# Patient Record
Sex: Male | Born: 1965 | Race: Black or African American | Hispanic: No | Smoking: Current every day smoker
Health system: Southern US, Community
[De-identification: ages and names within clinical notes are randomized; demographics above are authoritative.]

## PROBLEM LIST (undated history)

## (undated) ENCOUNTER — Emergency Department (HOSPITAL_COMMUNITY): Admission: EM | Payer: Medicare Other | Source: Home / Self Care

## (undated) DIAGNOSIS — F319 Bipolar disorder, unspecified: Secondary | ICD-10-CM

## (undated) DIAGNOSIS — F329 Major depressive disorder, single episode, unspecified: Secondary | ICD-10-CM

## (undated) DIAGNOSIS — I10 Essential (primary) hypertension: Secondary | ICD-10-CM

## (undated) DIAGNOSIS — Z21 Asymptomatic human immunodeficiency virus [HIV] infection status: Secondary | ICD-10-CM

## (undated) DIAGNOSIS — I219 Acute myocardial infarction, unspecified: Secondary | ICD-10-CM

## (undated) DIAGNOSIS — F419 Anxiety disorder, unspecified: Secondary | ICD-10-CM

## (undated) DIAGNOSIS — B20 Human immunodeficiency virus [HIV] disease: Secondary | ICD-10-CM

## (undated) DIAGNOSIS — F32A Depression, unspecified: Secondary | ICD-10-CM

---

## 2016-10-02 DIAGNOSIS — I10 Essential (primary) hypertension: Secondary | ICD-10-CM | POA: Diagnosis not present

## 2016-10-02 DIAGNOSIS — F319 Bipolar disorder, unspecified: Secondary | ICD-10-CM | POA: Diagnosis not present

## 2016-10-02 DIAGNOSIS — Z21 Asymptomatic human immunodeficiency virus [HIV] infection status: Secondary | ICD-10-CM | POA: Diagnosis not present

## 2016-10-23 DIAGNOSIS — Z23 Encounter for immunization: Secondary | ICD-10-CM | POA: Diagnosis not present

## 2017-02-15 DIAGNOSIS — S00452A Superficial foreign body of left ear, initial encounter: Secondary | ICD-10-CM | POA: Diagnosis not present

## 2017-02-15 DIAGNOSIS — L539 Erythematous condition, unspecified: Secondary | ICD-10-CM | POA: Diagnosis not present

## 2017-02-15 DIAGNOSIS — I10 Essential (primary) hypertension: Secondary | ICD-10-CM | POA: Diagnosis not present

## 2017-02-15 DIAGNOSIS — Z79899 Other long term (current) drug therapy: Secondary | ICD-10-CM | POA: Diagnosis not present

## 2017-02-15 DIAGNOSIS — T162XXA Foreign body in left ear, initial encounter: Secondary | ICD-10-CM | POA: Diagnosis not present

## 2017-02-15 DIAGNOSIS — F17210 Nicotine dependence, cigarettes, uncomplicated: Secondary | ICD-10-CM | POA: Diagnosis not present

## 2017-02-15 DIAGNOSIS — Z842 Family history of other diseases of the genitourinary system: Secondary | ICD-10-CM | POA: Diagnosis not present

## 2017-02-15 DIAGNOSIS — Z21 Asymptomatic human immunodeficiency virus [HIV] infection status: Secondary | ICD-10-CM | POA: Diagnosis not present

## 2017-02-22 DIAGNOSIS — R760 Raised antibody titer: Secondary | ICD-10-CM | POA: Diagnosis not present

## 2017-02-22 DIAGNOSIS — R739 Hyperglycemia, unspecified: Secondary | ICD-10-CM | POA: Diagnosis not present

## 2017-02-22 DIAGNOSIS — I10 Essential (primary) hypertension: Secondary | ICD-10-CM | POA: Diagnosis not present

## 2017-02-22 DIAGNOSIS — E782 Mixed hyperlipidemia: Secondary | ICD-10-CM | POA: Diagnosis not present

## 2017-02-22 DIAGNOSIS — Z21 Asymptomatic human immunodeficiency virus [HIV] infection status: Secondary | ICD-10-CM | POA: Diagnosis not present

## 2017-03-19 DIAGNOSIS — R739 Hyperglycemia, unspecified: Secondary | ICD-10-CM | POA: Diagnosis not present

## 2017-03-19 DIAGNOSIS — I10 Essential (primary) hypertension: Secondary | ICD-10-CM | POA: Diagnosis not present

## 2017-03-19 DIAGNOSIS — Z21 Asymptomatic human immunodeficiency virus [HIV] infection status: Secondary | ICD-10-CM | POA: Diagnosis not present

## 2017-03-19 DIAGNOSIS — F329 Major depressive disorder, single episode, unspecified: Secondary | ICD-10-CM | POA: Diagnosis not present

## 2017-03-21 DIAGNOSIS — F319 Bipolar disorder, unspecified: Secondary | ICD-10-CM | POA: Diagnosis not present

## 2017-04-06 DIAGNOSIS — I10 Essential (primary) hypertension: Secondary | ICD-10-CM | POA: Diagnosis not present

## 2017-04-06 DIAGNOSIS — Z21 Asymptomatic human immunodeficiency virus [HIV] infection status: Secondary | ICD-10-CM | POA: Diagnosis not present

## 2017-04-06 DIAGNOSIS — Z720 Tobacco use: Secondary | ICD-10-CM | POA: Diagnosis not present

## 2017-04-10 DIAGNOSIS — Z720 Tobacco use: Secondary | ICD-10-CM | POA: Diagnosis not present

## 2017-04-10 DIAGNOSIS — I10 Essential (primary) hypertension: Secondary | ICD-10-CM | POA: Diagnosis not present

## 2017-04-10 DIAGNOSIS — D72819 Decreased white blood cell count, unspecified: Secondary | ICD-10-CM | POA: Diagnosis not present

## 2017-04-16 DIAGNOSIS — B20 Human immunodeficiency virus [HIV] disease: Secondary | ICD-10-CM | POA: Diagnosis not present

## 2017-04-16 DIAGNOSIS — Z720 Tobacco use: Secondary | ICD-10-CM | POA: Diagnosis not present

## 2017-04-16 DIAGNOSIS — I10 Essential (primary) hypertension: Secondary | ICD-10-CM | POA: Diagnosis not present

## 2017-04-16 DIAGNOSIS — S0081XD Abrasion of other part of head, subsequent encounter: Secondary | ICD-10-CM | POA: Diagnosis not present

## 2017-04-17 DIAGNOSIS — Z79899 Other long term (current) drug therapy: Secondary | ICD-10-CM | POA: Diagnosis not present

## 2017-04-18 DIAGNOSIS — F319 Bipolar disorder, unspecified: Secondary | ICD-10-CM | POA: Diagnosis not present

## 2017-04-22 DIAGNOSIS — Z79899 Other long term (current) drug therapy: Secondary | ICD-10-CM | POA: Diagnosis not present

## 2017-05-23 DIAGNOSIS — Z79899 Other long term (current) drug therapy: Secondary | ICD-10-CM | POA: Diagnosis not present

## 2017-07-08 ENCOUNTER — Encounter: Payer: Self-pay | Admitting: Internal Medicine

## 2017-07-08 DIAGNOSIS — I10 Essential (primary) hypertension: Secondary | ICD-10-CM | POA: Diagnosis not present

## 2017-07-08 DIAGNOSIS — E782 Mixed hyperlipidemia: Secondary | ICD-10-CM | POA: Diagnosis not present

## 2017-07-08 DIAGNOSIS — Z21 Asymptomatic human immunodeficiency virus [HIV] infection status: Secondary | ICD-10-CM | POA: Diagnosis not present

## 2017-07-08 DIAGNOSIS — Z113 Encounter for screening for infections with a predominantly sexual mode of transmission: Secondary | ICD-10-CM | POA: Diagnosis not present

## 2017-07-08 DIAGNOSIS — Z23 Encounter for immunization: Secondary | ICD-10-CM | POA: Diagnosis not present

## 2017-07-08 DIAGNOSIS — R972 Elevated prostate specific antigen [PSA]: Secondary | ICD-10-CM | POA: Diagnosis not present

## 2017-07-08 DIAGNOSIS — F329 Major depressive disorder, single episode, unspecified: Secondary | ICD-10-CM | POA: Diagnosis not present

## 2017-07-08 DIAGNOSIS — Z13220 Encounter for screening for lipoid disorders: Secondary | ICD-10-CM | POA: Diagnosis not present

## 2017-07-08 DIAGNOSIS — Z131 Encounter for screening for diabetes mellitus: Secondary | ICD-10-CM | POA: Diagnosis not present

## 2017-07-08 DIAGNOSIS — R030 Elevated blood-pressure reading, without diagnosis of hypertension: Secondary | ICD-10-CM | POA: Diagnosis not present

## 2017-07-08 DIAGNOSIS — F319 Bipolar disorder, unspecified: Secondary | ICD-10-CM | POA: Diagnosis not present

## 2017-08-01 NOTE — Congregational Nurse Program (Signed)
Congregational Nurse Program Note  Date of Encounter: 07/24/2017  Past Medical History: No past medical history on file.  Encounter Details: CNP Questionnaire - 07/24/17 0935      Questionnaire   Patient Status  Not Applicable    Race  Black or African American    Location Patient Served At  Not Applicable    Insurance  Medicaid    Uninsured  Not Applicable    Food  Yes, have food insecurities;Within past 12 months, worried food would run out with no money to buy more;Within past 12 months, food ran out with no money to buy more    Housing/Utilities  No permanent housing    Transportation  Yes, need transportation assistance;Provided transportation assistance (bus pass, taxi voucher, etc.)    Interpersonal Safety  No, do not feel physically and emotionally safe where you currently live    Medication  Yes, have medication insecurities    Medical Provider  No    Referrals  Other    ED Visit Averted  Not Applicable    Life-Saving Intervention Made  Not Applicable       Has recently arrived from the [place] area.  Homeless.  Referred to me by social work Tax inspectorintern.  Safety plan developed as client is depressed and feeling hopeless.  Intern was able to [facility] as a lobby guest.  Client to check in with me on Friday.

## 2017-08-10 ENCOUNTER — Emergency Department (HOSPITAL_COMMUNITY): Payer: Medicare Other

## 2017-08-10 ENCOUNTER — Encounter (HOSPITAL_COMMUNITY): Payer: Self-pay | Admitting: Oncology

## 2017-08-10 ENCOUNTER — Emergency Department (HOSPITAL_COMMUNITY)
Admission: EM | Admit: 2017-08-10 | Discharge: 2017-08-10 | Disposition: A | Discharge status: Home / Self Care | Payer: Medicare Other | Attending: Emergency Medicine | Admitting: Emergency Medicine

## 2017-08-10 ENCOUNTER — Other Ambulatory Visit: Payer: Self-pay

## 2017-08-10 DIAGNOSIS — I10 Essential (primary) hypertension: Secondary | ICD-10-CM | POA: Diagnosis not present

## 2017-08-10 DIAGNOSIS — R079 Chest pain, unspecified: Secondary | ICD-10-CM | POA: Diagnosis not present

## 2017-08-10 DIAGNOSIS — Z21 Asymptomatic human immunodeficiency virus [HIV] infection status: Secondary | ICD-10-CM | POA: Insufficient documentation

## 2017-08-10 DIAGNOSIS — F319 Bipolar disorder, unspecified: Secondary | ICD-10-CM | POA: Diagnosis not present

## 2017-08-10 DIAGNOSIS — F419 Anxiety disorder, unspecified: Secondary | ICD-10-CM | POA: Insufficient documentation

## 2017-08-10 DIAGNOSIS — F410 Panic disorder [episodic paroxysmal anxiety] without agoraphobia: Secondary | ICD-10-CM | POA: Diagnosis not present

## 2017-08-10 DIAGNOSIS — F919 Conduct disorder, unspecified: Secondary | ICD-10-CM | POA: Diagnosis not present

## 2017-08-10 DIAGNOSIS — F99 Mental disorder, not otherwise specified: Secondary | ICD-10-CM

## 2017-08-10 DIAGNOSIS — I252 Old myocardial infarction: Secondary | ICD-10-CM | POA: Diagnosis not present

## 2017-08-10 DIAGNOSIS — F17210 Nicotine dependence, cigarettes, uncomplicated: Secondary | ICD-10-CM | POA: Insufficient documentation

## 2017-08-10 HISTORY — DX: Essential (primary) hypertension: I10

## 2017-08-10 HISTORY — DX: Anxiety disorder, unspecified: F41.9

## 2017-08-10 HISTORY — DX: Asymptomatic human immunodeficiency virus (hiv) infection status: Z21

## 2017-08-10 HISTORY — DX: Acute myocardial infarction, unspecified: I21.9

## 2017-08-10 HISTORY — DX: Bipolar disorder, unspecified: F31.9

## 2017-08-10 HISTORY — DX: Human immunodeficiency virus (HIV) disease: B20

## 2017-08-10 NOTE — ED Notes (Signed)
Pt is endorsing thoughts of suicide w/ plan to overdose on his medication and ETOH.  Pt is calm and cooperative at this time.
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
## 2017-08-10 NOTE — ED Triage Notes (Signed)
Pt bib [facility] d/t CP.  Per EMS pt was walking when he developed shob and CP.  Pt has hx of MI x 2.  Per EMS, pt also w/ hx of anxiety.  Tonight pt had a verbal altercation w/ family.  Etoh on board.  Pt given 324 mg asa as well as 1 nitro tab en route.

## 2017-08-11 ENCOUNTER — Other Ambulatory Visit: Payer: Self-pay

## 2017-08-11 ENCOUNTER — Encounter (HOSPITAL_COMMUNITY): Payer: Self-pay | Admitting: Emergency Medicine

## 2017-08-11 ENCOUNTER — Emergency Department (HOSPITAL_COMMUNITY)
Admission: EM | Admit: 2017-08-11 | Discharge: 2017-08-12 | Disposition: A | Discharge status: Home / Self Care | Payer: Medicare Other | Attending: Emergency Medicine | Admitting: Emergency Medicine

## 2017-08-11 DIAGNOSIS — F1020 Alcohol dependence, uncomplicated: Secondary | ICD-10-CM | POA: Insufficient documentation

## 2017-08-11 DIAGNOSIS — I252 Old myocardial infarction: Secondary | ICD-10-CM | POA: Diagnosis not present

## 2017-08-11 DIAGNOSIS — B20 Human immunodeficiency virus [HIV] disease: Secondary | ICD-10-CM | POA: Diagnosis not present

## 2017-08-11 DIAGNOSIS — I10 Essential (primary) hypertension: Secondary | ICD-10-CM | POA: Insufficient documentation

## 2017-08-11 DIAGNOSIS — Z79899 Other long term (current) drug therapy: Secondary | ICD-10-CM | POA: Diagnosis not present

## 2017-08-11 DIAGNOSIS — F17210 Nicotine dependence, cigarettes, uncomplicated: Secondary | ICD-10-CM | POA: Diagnosis not present

## 2017-08-11 DIAGNOSIS — R45851 Suicidal ideations: Secondary | ICD-10-CM | POA: Diagnosis not present

## 2017-08-11 DIAGNOSIS — F1414 Cocaine abuse with cocaine-induced mood disorder: Secondary | ICD-10-CM | POA: Diagnosis present

## 2017-08-11 DIAGNOSIS — F419 Anxiety disorder, unspecified: Secondary | ICD-10-CM | POA: Diagnosis not present

## 2017-08-11 HISTORY — DX: Depression, unspecified: F32.A

## 2017-08-11 HISTORY — DX: Major depressive disorder, single episode, unspecified: F32.9

## 2017-08-11 LAB — COMPREHENSIVE METABOLIC PANEL
ALK PHOS: 123 U/L (ref 38–126)
ALT: 47 U/L (ref 17–63)
AST: 36 U/L (ref 15–41)
Albumin: 4.3 g/dL (ref 3.5–5.0)
Anion gap: 9 (ref 5–15)
BUN: 10 mg/dL (ref 6–20)
CALCIUM: 9.1 mg/dL (ref 8.9–10.3)
CO2: 25 mmol/L (ref 22–32)
CREATININE: 1.08 mg/dL (ref 0.61–1.24)
Chloride: 108 mmol/L (ref 101–111)
Glucose, Bld: 106 mg/dL — ABNORMAL HIGH (ref 65–99)
Potassium: 3.8 mmol/L (ref 3.5–5.1)
Sodium: 142 mmol/L (ref 135–145)
TOTAL PROTEIN: 7.7 g/dL (ref 6.5–8.1)
Total Bilirubin: 0.7 mg/dL (ref 0.3–1.2)

## 2017-08-11 LAB — RAPID URINE DRUG SCREEN, HOSP PERFORMED
AMPHETAMINES: NOT DETECTED
BARBITURATES: NOT DETECTED
BENZODIAZEPINES: NOT DETECTED
COCAINE: POSITIVE — AB
Opiates: NOT DETECTED
TETRAHYDROCANNABINOL: NOT DETECTED

## 2017-08-11 LAB — CBC
HCT: 41.5 % (ref 39.0–52.0)
Hemoglobin: 14.2 g/dL (ref 13.0–17.0)
MCH: 29.1 pg (ref 26.0–34.0)
MCHC: 34.2 g/dL (ref 30.0–36.0)
MCV: 85 fL (ref 78.0–100.0)
Platelets: 339 10*3/uL (ref 150–400)
RBC: 4.88 MIL/uL (ref 4.22–5.81)
RDW: 14.1 % (ref 11.5–15.5)
WBC: 5.2 10*3/uL (ref 4.0–10.5)

## 2017-08-11 LAB — ETHANOL: ALCOHOL ETHYL (B): 74 mg/dL — AB (ref <10)

## 2017-08-11 LAB — ACETAMINOPHEN LEVEL: Acetaminophen (Tylenol), Serum: <10 ug/mL — ABNORMAL LOW (ref 10–30)

## 2017-08-11 LAB — SALICYLATE LEVEL

## 2017-08-11 MED ORDER — HYDROXYZINE HCL 25 MG PO TABS: 25.0000 mg | ORAL_TABLET | Freq: Three times a day (TID) | ORAL | Status: DC | PRN

## 2017-08-11 MED ORDER — CITALOPRAM HYDROBROMIDE 10 MG PO TABS
20.0000 mg | ORAL_TABLET | Freq: Every day | ORAL | Status: DC
  Administered 2017-08-11 – 2017-08-12 (×2): 20 mg via ORAL
  Filled 2017-08-11 (×2): qty 2

## 2017-08-11 MED ORDER — NICOTINE 21 MG/24HR TD PT24
21.0000 mg | MEDICATED_PATCH | Freq: Every day | TRANSDERMAL | Status: DC
  Administered 2017-08-11 – 2017-08-12 (×3): 21 mg via TRANSDERMAL
  Filled 2017-08-11 (×3): qty 1

## 2017-08-11 MED ORDER — CITALOPRAM HYDROBROMIDE 10 MG PO TABS: 40.0000 mg | ORAL_TABLET | Freq: Every day | ORAL | Status: DC

## 2017-08-11 MED ORDER — GABAPENTIN 400 MG PO CAPS
400.0000 mg | ORAL_CAPSULE | Freq: Two times a day (BID) | ORAL | Status: DC
  Administered 2017-08-11 – 2017-08-12 (×3): 400 mg via ORAL
  Filled 2017-08-11 (×3): qty 1

## 2017-08-11 MED ORDER — BUSPIRONE HCL 10 MG PO TABS
10.0000 mg | ORAL_TABLET | Freq: Two times a day (BID) | ORAL | Status: DC
Start: 2017-08-11 — End: 2017-08-12
  Administered 2017-08-11 – 2017-08-12 (×3): 10 mg via ORAL
  Filled 2017-08-11 (×3): qty 1

## 2017-08-11 MED ORDER — TRAZODONE HCL 100 MG PO TABS: 100.0000 mg | ORAL_TABLET | Freq: Every evening | ORAL | Status: DC | PRN

## 2017-08-11 NOTE — BH Assessment (Signed)
[facility] Assessment Progress Note  Per Nira ConnJason Berry, NP pt is recommended for inpt treatment. TTS attempted to notify the EDP of the recommendation however did not get an answer after multiple attempts at calling PA. Pt's nurse Stephen HiddenGary, RN was notified of the disposition. TTS to seek placement.   Stephen Gordon, MSW, LCSW Therapeutic Triage Specialist  [phone]
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
## 2017-08-11 NOTE — ED Notes (Signed)
Hourly rounding reveals patient sleeping in room. No complaints, stable, in no acute distress. Q15 minute rounds and monitoring via Security Cameras to continue. 

## 2017-08-11 NOTE — ED Notes (Signed)
Report to include Situation, Background, Assessment, and Recommendations [facility]. Patient alert and oriented, warm and dry, in no acute distress. Patient denies, VH and pain. Patient states he has SI, and HI towards his Ex. And hears voices without command. Patient made aware of Q15 minute rounds and security cameras for their safety. Patient instructed to come to me with needs or concerns.

## 2017-08-11 NOTE — ED Triage Notes (Signed)
Patient is feeling suicidal. Patient states he wants to walk out in front of traffic. Patient states he has had some drinks. Patient states he wants to kill himself because his wife cheated on him.

## 2017-08-11 NOTE — ED Notes (Signed)
Pt. Transferred to [facility] from ED to room 36 after screening for contraband. Report to include Situation, Background, Assessment and Recommendations from [facility] ([facility]Rh[facility]ravia RN. Pt. Oriented to unit including Q15 minute rounds as well as the security cameras for their protection. Patient is alert and oriented, warm and dry in no acute distress. Patient denies, VH. Pt. States he has SI, HI and AH telling him to "hurt myself" Pt. Encouraged to let me know if needs arise.
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
## 2017-08-11 NOTE — ED Notes (Signed)
TTS at bedside. 

## 2017-08-11 NOTE — ED Notes (Signed)
Patient is for discharge in the morning. However if he acts out inappropriately he may be discharged immediately per Elliot GurneyJamilson Lord NP

## 2017-08-11 NOTE — ED Notes (Signed)
Patient alert and oriented. Patient states he is having thoughts of harming himself and that's why he came into hospital. Patient did not want to discuss in details. Patient contracts for safety while on unit. Patient denies pain. Patient provided support and encouragement and Q 15 minute checks in progress and patient remains safe on unit. Patient denies HI and A/V hallucinations. Monitoring of patient continues.
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
## 2017-08-12 DIAGNOSIS — F1020 Alcohol dependence, uncomplicated: Secondary | ICD-10-CM | POA: Diagnosis not present

## 2017-08-12 DIAGNOSIS — F419 Anxiety disorder, unspecified: Secondary | ICD-10-CM

## 2017-08-12 DIAGNOSIS — F17210 Nicotine dependence, cigarettes, uncomplicated: Secondary | ICD-10-CM

## 2017-08-12 DIAGNOSIS — R4587 Impulsiveness: Secondary | ICD-10-CM

## 2017-08-12 DIAGNOSIS — F1414 Cocaine abuse with cocaine-induced mood disorder: Secondary | ICD-10-CM | POA: Diagnosis not present

## 2017-08-12 NOTE — Discharge Instructions (Signed)
For your behavioral health needs, you are advised to follow up with [facility].  Contact them at your earliest opportunity to ask about scheduling an intake appointment: ° °     [facility] °     [address] °     [place], [place] [address] °     [phone] °

## 2017-08-12 NOTE — ED Notes (Signed)
Hourly rounding reveals patient sleeping in room. No complaints, stable, in no acute distress. Q15 minute rounds and monitoring via Security Cameras to continue. 
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
## 2017-08-12 NOTE — [facility] Suicide Risk Assessment (Signed)
Suicide Risk Assessment  Discharge Assessment   [facility]   Principal Problem: Cocaine abuse with cocaine-induced mood disorder [facility](HCC) Discharge Diagnoses:  Patient Active Problem List   Diagnosis Date Noted  . Cocaine abuse with cocaine-induced mood disorder (HCC) [F14.14] 08/11/2017  . Alcohol use disorder, moderate, dependence (HCC) [F10.20] 08/11/2017    Total Time spent with patient: 45 minutes  Musculoskeletal: Strength & Muscle Tone: within normal limits Gait & Station: normal Patient leans: N/A  Psychiatric Specialty Exam: Physical Exam  Constitutional: He is oriented to person, place, and time. He appears well-developed and well-nourished.  HENT:  Head: Normocephalic.  Respiratory: Effort normal.  Musculoskeletal: Normal range of motion.  Neurological: He is alert and oriented to person, place, and time.  Psychiatric: Thought content normal. His mood appears anxious. His speech is rapid and/or pressured. He is agitated. Cognition and memory are normal. He expresses impulsivity. He exhibits a depressed mood.   ROS Blood pressure (!) 151/54, pulse (!) 101, temperature 99.5 F (37.5 C), temperature source Oral, resp. rate 16, height 5\' 9"  (1.753 m), weight 76.2 kg (168 lb), SpO2 97 %.Body mass index is 24.81 kg/m. General Appearance: Casual Eye Contact:  Good Speech:  Clear and Coherent and Pressured Volume:  Normal Mood:  Anxious and Depressed Affect:  Congruent and Depressed Thought Process:  Coherent, Goal Directed and Linear Orientation:  Full (Time, Place, and Person) Thought Content:  Logical Suicidal Thoughts:  No Homicidal Thoughts:  No Memory:  Immediate;   Good Recent;   Good Remote;   Fair Judgement:  Fair Insight:  Fair Psychomotor Activity:  Normal Concentration:  Concentration: Good and Attention Span: Good Recall:  Good Fund of Knowledge:  Good Language:  Good Akathisia:  No Handed:  Right AIMS (if indicated):     Assets:  Communication Skills Physical Health Social Support ADL's:  Intact Cognition:  WNL   Mental Status Per Nursing Assessment::   On Admission:   suicidal ideation while "high on cocaine and alcohol"  Demographic Factors:  Male, Low socioeconomic status and Unemployed  Loss Factors: Financial problems/change in socioeconomic status  Historical Factors: Impulsivity  Risk Reduction Factors:   Sense of responsibility to family  Continued Clinical Symptoms:  Alcohol/Substance Abuse/Dependencies  Cognitive Features That Contribute To Risk:  Closed-mindedness    Suicide Risk:  Minimal: No identifiable suicidal ideation.  Patients presenting with no risk factors but with morbid ruminations; may be classified as minimal risk based on the severity of the depressive symptoms    Plan Of Care/Follow-up recommendations:  Activity:  Cocaine abuse with cocaine induced mood disorder Diet:  Heart healthy  Laveda AbbeLaurie Britton Erline Siddoway, NP 08/12/2017, 11:24 AM

## 2017-08-12 NOTE — BH Assessment (Addendum)
B[facility] Assessment Progress Note  Per Thedore MinsMojeed Akintayo, MD, this pt does not require psychiatric hospitalization at this time.  [facility].  This has been included in pt's discharge instructions.  Pt would also benefit from seeing Peer Support Specialists; they will be asked to speak to pt.  Pt's nurse, Kendal Hymendie, has been notified.  Doylene Canninghomas Erica Richwine, MA Triage Specialist [phone]

## 2017-08-12 NOTE — ED Notes (Addendum)
Pt discharged ambulatory with resources.  Discharge instructions to follow up with [facility] was given.  All belongings were returned to pt.  Bus pass was given.  Pt refused discharge vitals.

## 2017-08-13 ENCOUNTER — Ambulatory Visit (HOSPITAL_COMMUNITY): Payer: Self-pay

## 2017-08-15 DIAGNOSIS — Z789 Other specified health status: Secondary | ICD-10-CM | POA: Diagnosis not present

## 2017-08-16 DIAGNOSIS — Z789 Other specified health status: Secondary | ICD-10-CM | POA: Diagnosis not present

## 2017-09-19 ENCOUNTER — Telehealth (HOSPITAL_COMMUNITY): Payer: Self-pay

## 2017-09-26 NOTE — Congregational Nurse Program (Signed)
Congregational Nurse Program Note  Date of Encounter: 08/30/2017  Past Medical History: Past Medical History:  Diagnosis Date  . Anxiety   . Bipolar 1 disorder (HCC)   . Bipolar 1 disorder (HCC)   . Depression   . HIV (human immunodeficiency virus infection) (HCC)   . Hypertension   . MI (myocardial infarction) (HCC)    x 2    Encounter Details: CNP Questionnaire - 08/30/17 1619      Questionnaire   Patient Status  Not Applicable    Race  Black or African American    Location Patient Served At  Not Applicable    Insurance  Medicaid    Uninsured  Not Applicable    Food  Yes, have food insecurities;Within past 12 months, worried food would run out with no money to buy more;Within past 12 months, food ran out with no money to buy more    Housing/Utilities  No permanent housing    Transportation  Yes, need transportation assistance;Provided transportation assistance (bus pass, taxi voucher, etc.)    Interpersonal Safety  No, do not feel physically and emotionally safe where you currently live    Medication  Yes, have medication insecurities    Medical Provider  No    Referrals  Other    ED Visit Averted  Not Applicable    Life-Saving Intervention Made  Not Applicable     Requesting assistance with resources for substance abuse and his depression  Referred to [facility] for [facility]ative care

## 2017-10-04 NOTE — Congregational Nurse Program (Signed)
Congregational Nurse Program Note  Date of Encounter: 10/02/2017  Past Medical History: Past Medical History:  Diagnosis Date  . Anxiety   . Bipolar 1 disorder (HCC)   . Bipolar 1 disorder (HCC)   . Depression   . HIV (human immunodeficiency virus infection) (HCC)   . Hypertension   . MI (myocardial infarction) (HCC)    x 2    Encounter Details: CNP Questionnaire - 10/02/17 1451      Questionnaire   Patient Status  Not Applicable    Race  Black or African American    Location Patient Served At  Not Applicable    Insurance  Medicaid    Uninsured  Not Applicable    Food  Yes, have food insecurities;Within past 12 months, worried food would run out with no money to buy more;Within past 12 months, food ran out with no money to buy more    Housing/Utilities  No permanent housing    Transportation  Yes, need transportation assistance;Provided transportation assistance (bus pass, taxi voucher, etc.)    Interpersonal Safety  No, do not feel physically and emotionally safe where you currently live    Medication  Yes, have medication insecurities    Medical Provider  No    Referrals  Restaurant manager, fast foodther;Area Agency;Behavioral/Mental Health Provider;Primary Care Provider/Clinic    ED Visit Averted  Not Applicable    Life-Saving Intervention Made  Not Applicable      States has not been able to be seen at [facility] due to volume.  He was directed to come back after the holidays.  Bus passes given to return to [facility] for behavioral health and substance abuse treatment.  In the process of obtaining appointment [facility].  Client is to return to see me on Friday to sign ROI for the [facility] to obtain his [place] from [place].

## 2017-10-09 ENCOUNTER — Other Ambulatory Visit: Payer: Self-pay

## 2017-10-09 ENCOUNTER — Emergency Department (HOSPITAL_COMMUNITY): Payer: Medicare Other

## 2017-10-09 ENCOUNTER — Emergency Department (HOSPITAL_COMMUNITY)
Admission: EM | Admit: 2017-10-09 | Discharge: 2017-10-09 | Disposition: A | Discharge status: Home / Self Care | Payer: Medicare Other | Attending: Emergency Medicine | Admitting: Emergency Medicine

## 2017-10-09 ENCOUNTER — Encounter (HOSPITAL_COMMUNITY): Payer: Self-pay

## 2017-10-09 DIAGNOSIS — R0602 Shortness of breath: Secondary | ICD-10-CM | POA: Diagnosis not present

## 2017-10-09 DIAGNOSIS — I10 Essential (primary) hypertension: Secondary | ICD-10-CM | POA: Insufficient documentation

## 2017-10-09 DIAGNOSIS — R062 Wheezing: Secondary | ICD-10-CM | POA: Diagnosis not present

## 2017-10-09 DIAGNOSIS — Z21 Asymptomatic human immunodeficiency virus [HIV] infection status: Secondary | ICD-10-CM | POA: Insufficient documentation

## 2017-10-09 DIAGNOSIS — Z79899 Other long term (current) drug therapy: Secondary | ICD-10-CM | POA: Insufficient documentation

## 2017-10-09 DIAGNOSIS — R0789 Other chest pain: Secondary | ICD-10-CM | POA: Diagnosis not present

## 2017-10-09 DIAGNOSIS — F17210 Nicotine dependence, cigarettes, uncomplicated: Secondary | ICD-10-CM | POA: Insufficient documentation

## 2017-10-09 LAB — BASIC METABOLIC PANEL
Anion gap: 11 (ref 5–15)
BUN: 14 mg/dL (ref 6–20)
CHLORIDE: 106 mmol/L (ref 101–111)
CO2: 20 mmol/L — AB (ref 22–32)
CREATININE: 0.93 mg/dL (ref 0.61–1.24)
Calcium: 9.6 mg/dL (ref 8.9–10.3)
GFR calc non Af Amer: >60 mL/min (ref >60)
Glucose, Bld: 104 mg/dL — ABNORMAL HIGH (ref 65–99)
POTASSIUM: 4 mmol/L (ref 3.5–5.1)
Sodium: 137 mmol/L (ref 135–145)

## 2017-10-09 LAB — CBC
HEMATOCRIT: 43.1 % (ref 39.0–52.0)
Hemoglobin: 14.5 g/dL (ref 13.0–17.0)
MCH: 29.5 pg (ref 26.0–34.0)
MCHC: 33.6 g/dL (ref 30.0–36.0)
MCV: 87.8 fL (ref 78.0–100.0)
PLATELETS: 282 10*3/uL (ref 150–400)
RBC: 4.91 MIL/uL (ref 4.22–5.81)
RDW: 15.7 % — ABNORMAL HIGH (ref 11.5–15.5)
WBC: 5.1 10*3/uL (ref 4.0–10.5)

## 2017-10-09 LAB — I-STAT TROPONIN, ED
Troponin i, poc: 0.01 ng/mL (ref 0.00–0.08)
Troponin i, poc: 0 ng/mL (ref 0.00–0.08)

## 2017-10-09 MED ORDER — ALBUTEROL SULFATE HFA 108 (90 BASE) MCG/ACT IN AERS
2.0000 | INHALATION_SPRAY | RESPIRATORY_TRACT | Status: DC | PRN
  Administered 2017-10-09: 2 via RESPIRATORY_TRACT
  Filled 2017-10-09: qty 6.7

## 2017-10-09 MED ORDER — IPRATROPIUM BROMIDE 0.02 % IN SOLN
0.5000 mg | Freq: Once | RESPIRATORY_TRACT | Status: AC
Start: 2017-10-09 — End: 2017-10-09
  Administered 2017-10-09: 0.5 mg via RESPIRATORY_TRACT
  Filled 2017-10-09: qty 2.5

## 2017-10-09 MED ORDER — ALBUTEROL SULFATE (2.5 MG/3ML) 0.083% IN NEBU
5.0000 mg | INHALATION_SOLUTION | Freq: Once | RESPIRATORY_TRACT | Status: AC
Start: 2017-10-09 — End: 2017-10-09
  Administered 2017-10-09: 5 mg via RESPIRATORY_TRACT
  Filled 2017-10-09: qty 6

## 2017-10-09 NOTE — ED Notes (Signed)
ED Provider at bedside. 

## 2017-10-09 NOTE — ED Triage Notes (Signed)
Pt states that he was on a smoke break when he began to have chest tightness and SOB, states he just got over a cold. Denies other cardiac symptoms

## 2017-10-09 NOTE — ED Notes (Signed)
Pt ambulated in the hallway independently with oxygen staying at 100%, reports chest still feels tight, edp notified.

## 2017-10-09 NOTE — ED Notes (Signed)
edp at bedside  

## 2017-10-09 NOTE — ED Notes (Signed)
Ambulated pt in hallway w/ Courtney(RN). Pt SpO2 stayed at 100% and ambulated with a steady gait.

## 2017-10-09 NOTE — Discharge Instructions (Signed)
Please read and follow all provided instructions.  Your diagnoses today include:  1. Chest tightness    Tests performed today include:  An EKG of your heart  A chest x-ray  Cardiac enzymes - a blood test for heart muscle damage  Blood counts and electrolytes  Vital signs. See below for your results today.   Medications prescribed:   Albuterol inhaler - medication that opens up your airway  Use inhaler as follows: 1-2 puffs with spacer every 4 hours as needed for wheezing, cough, or shortness of breath.   Take any prescribed medications only as directed.  Follow-up instructions: Please follow-up with your primary care provider as soon as you can for further evaluation of your symptoms.   Return instructions:  SEEK IMMEDIATE MEDICAL ATTENTION IF:  You have severe chest pain, especially if the pain is crushing or pressure-like and spreads to the arms, back, neck, or jaw, or if you have sweating, nausea (feeling sick to your stomach), or shortness of breath. THIS IS AN EMERGENCY. Don't wait to see if the pain will go away. Get medical help at once. Call 911 or 0 (operator). DO NOT drive yourself to the hospital.   Your chest pain gets worse and does not go away with rest.   You have an attack of chest pain lasting longer than usual, despite rest and treatment with the medications your caregiver has prescribed.   You wake from sleep with chest pain or shortness of breath.  You feel dizzy or faint.  You have chest pain not typical of your usual pain for which you originally saw your caregiver.   You have any other emergent concerns regarding your health.  Additional Information: Chest pain comes from many different causes. Your caregiver has diagnosed you as having chest pain that is not specific for one problem, but does not require admission.  You are at low risk for an acute heart condition or other serious illness.   Your vital signs today were: BP 125/86    Pulse 88     Temp 98.7 F (37.1 C) (Oral)    Resp 14    SpO2 96%  If your blood pressure (BP) was elevated above 135/85 this visit, please have this repeated by your doctor within one month. --------------

## 2017-10-09 NOTE — ED Provider Notes (Signed)
[facility] EMERGENCY DEPARTMENT Provider Note   CSN: [id number] Arrival date & time: 10/09/17  0343     History   Chief Complaint Chief Complaint  Patient presents with  . Chest Pain    HPI Stephen Gordon is a 52 y.o. male.  Patient with greater than 20 years smoking history, chronic shortness of breath, HIV, reported CAD, cocaine use --presents to the emergency department tonight with chest tightness, shortness of breath, wheezing starting approximately 11:30 PM.  Patient was at work smoking a cigarette when he developed tightness.  He reports having chronic shortness of breath for a very long time which she attributes to smoking.  This was not worse than usual.  However he states that he started breathing heavy with wheezing.  He attempted to go back to work but his boss asked him to take a break.  He did not seem to feel better so he was driven to the emergency department by coworker.  Currently he has some mild tightness in his chest.  Tightness does not radiate.  It is not associated with diaphoresis, exertion, vomiting.  Patient reports just getting over a recent viral illness which included nasal congestion and a cough.  Patient does have intermittent wheezing.  No lightheadedness or syncope.  No fevers, nausea, vomiting, or diarrhea.  Patient states that the last time that he used cocaine was 3-4 days ago.  States that he he is recently new to the area from [place]. Patient denies risk factors for pulmonary embolism including: unilateral leg swelling, history of DVT/PE/other blood clots, use of exogenous hormones, recent immobilizations, recent surgery, recent travel (>4hr segment), malignancy, hemoptysis. The onset of this condition was acute. The course is gradually improving. Aggravating factors: smoking. Alleviating factors: none.        Past Medical History:  Diagnosis Date  . Anxiety   . Bipolar 1 disorder (HCC)   . Bipolar 1 disorder (HCC)   . Depression    . HIV (human immunodeficiency virus infection) (HCC)   . Hypertension   . MI (myocardial infarction) (HCC)    x 2    Patient Active Problem List   Diagnosis Date Noted  . Cocaine abuse with cocaine-induced mood disorder (HCC) 08/11/2017  . Alcohol use disorder, moderate, dependence (HCC) 08/11/2017    History reviewed. No pertinent surgical history.     Home Medications    Prior to Admission medications   Medication Sig Start Date End Date Taking? Authorizing Provider  abacavir-dolutegravir-lamiVUDine (TRIUMEQ) 600-50-300 MG tablet Take 1 tablet daily by mouth.    [provider]  amLODipine (NORVASC) 10 MG tablet Take 10 mg daily by mouth.    [provider]  busPIRone (BUSPAR) 15 MG tablet Take 15 mg See admin instructions by mouth. Take 1 tablet three times a day and take 1 tablet twice a day as needed for anxiety.    [provider]  citalopram (CELEXA) 20 MG tablet Take 20 mg daily by mouth.    [provider]  gabapentin (NEURONTIN) 600 MG tablet Take 600 mg 3 (three) times daily by mouth.    [provider]  hydrOXYzine (VISTARIL) 25 MG capsule Take 25 mg 4 (four) times daily as needed by mouth for anxiety.    [provider]  naltrexone (DEPADE) 50 MG tablet Take 50 mg daily by mouth.    [provider]  Prenat w/o A-FE-Methfol-FA-DHA (PRENATE DHA PO) Take 1 capsule daily by mouth.    [provider]  traZODone (DESYREL) 50 MG tablet Take 50 mg at bedtime by mouth.    [provider]    Family History No family history on file.  Social History Social History   Tobacco Use  . Smoking status: Current Every Day Smoker    Packs/day: 0.50    Years: 30.00    Pack years: 15.00    Types: Cigarettes  . Smokeless tobacco: Never Used  Substance Use Topics  . Alcohol use: Yes    Comment: Occasionaaly  . Drug use: No     Allergies   Patient has no known allergies.   Review of  Systems Review of Systems  Constitutional: Negative for diaphoresis and fever.  HENT: Negative for congestion.   Eyes: Negative for redness.  Respiratory: Positive for chest tightness and shortness of breath. Negative for cough.   Cardiovascular: Negative for chest pain, palpitations and leg swelling.  Gastrointestinal: Negative for abdominal pain, nausea and vomiting.  Genitourinary: Negative for dysuria.  Musculoskeletal: Negative for back pain and neck pain.  Skin: Negative for rash.  Neurological: Negative for syncope and light-headedness.  Psychiatric/Behavioral: The patient is not nervous/anxious.      Physical Exam Updated Vital Signs BP 138/76   Pulse 99   Temp 98.7 F (37.1 C) (Oral)   Resp 18   SpO2 100%   Physical Exam  Constitutional: He appears well-developed and well-nourished.  HENT:  Head: Normocephalic and atraumatic.  Mouth/Throat: Mucous membranes are normal. Mucous membranes are not dry.  Eyes: Conjunctivae are normal.  Neck: Trachea normal and normal range of motion. Neck supple. Normal carotid pulses and no JVD present. No muscular tenderness present. Carotid bruit is not present. No tracheal deviation present.  Cardiovascular: Normal rate, regular rhythm, S1 normal, S2 normal, normal heart sounds and intact distal pulses. Exam reveals no distant heart sounds and no decreased pulses.  No murmur heard. Pulmonary/Chest: Effort normal. No respiratory distress. He has wheezes (mild, scattered). He has no rhonchi. He has no rales. He exhibits no tenderness.  Abdominal: Soft. Normal aorta and bowel sounds are normal. There is no tenderness. There is no rebound and no guarding.  Musculoskeletal: He exhibits no edema.  Neurological: He is alert.  Skin: Skin is warm and dry. He is not diaphoretic. No cyanosis. No pallor.  Psychiatric: He has a normal mood and affect.  Nursing note and vitals reviewed.    ED Treatments / Results  Labs (all labs ordered are  listed, but only abnormal results are displayed) Labs Reviewed  BASIC METABOLIC PANEL - Abnormal; Notable for the following components:      Result Value   CO2 20 (*)    Glucose, Bld 104 (*)    All other components within normal limits  CBC - Abnormal; Notable for the following components:   RDW 15.7 (*)    All other components within normal limits  I-STAT TROPONIN, ED  I-STAT TROPONIN, ED   ED ECG REPORT   Date: 10/09/2017  Rate: 96  Rhythm: normal sinus rhythm  QRS Axis: right  Intervals: normal  ST/T Wave abnormalities: normal  Conduction Disutrbances:none  Narrative Interpretation:   Old EKG Reviewed: unchanged  I have personally reviewed the EKG tracing and agree with the computerized printout as noted.    Radiology Dg Chest 2 View  Result Date: 10/09/2017 CLINICAL DATA:  52 y/o  M; chest tightness and shortness of breath. EXAM: CHEST  2 VIEW COMPARISON:  08/10/2017 chest radiographs FINDINGS: Stable heart size  and mediastinal contours are within normal limits. Both lungs are clear. The visualized skeletal structures are unremarkable. IMPRESSION: No acute pulmonary process identified. Electronically Signed   By: Mitzi HansenLance  Furusawa-Stratton M.D.   On: 10/09/2017 04:46    Procedures Procedures (including critical care time)  Medications Ordered in ED Medications  albuterol (PROVENTIL HFA;VENTOLIN HFA) 108 (90 Base) MCG/ACT inhaler 2 puff (2 puffs Inhalation Given 10/09/17 0843)  albuterol (PROVENTIL) (2.5 MG/3ML) 0.083% nebulizer solution 5 mg (5 mg Nebulization Given 10/09/17 0656)  ipratropium (ATROVENT) nebulizer solution 0.5 mg (0.5 mg Nebulization Given 10/09/17 0656)     Initial Impression / Assessment and Plan / ED Course  I have reviewed the triage vital signs and the nursing notes.  Pertinent labs & imaging results that were available during my care of the patient were reviewed by me and considered in my medical decision making (see chart for details).      Patient seen and examined.  Initial workup negative.  EKG without ischemic changes and unchanged.  First troponin was negative.  Symptoms seem more bronchitic in nature, however given history, risk factors including HIV, will perform delta troponin and recheck EKG.  Will give breathing treatment and assess for improvement.  Patient does not have any hypoxia or localized pain that would be concerning for PE or clinical signs of DVT. Initial troponin 0.01 -- it didn't cross over.   Vital signs reviewed and are as follows: BP 138/76   Pulse 99   Temp 98.7 F (37.1 C) (Oral)   Resp 18   SpO2 100%   8:19 AM 2nd trop 0.00 and repeat EKG unchanged.   Patient sleeping when I went to reexamine him.  Lungs are clear on exam.  He is in no respiratory distress.  States that he continues to feel some chest tightness.  States that the breathing medications helped him "a little bit".  Will ambulate patient to ensure that he does not become hypoxic.  Continue low concern for PE.  Will order albuterol inhaler for home.  9:03 AM Ambulated with O2 sat at 100%. Discussed plan with patient.   Encourage PCP follow-up in 2 days.  Home with albuterol inhaler.  Encouraged return with worsening chest pain, worsening shortness of breath, new symptoms or other concerns.  Final Clinical Impressions(s) / ED Diagnoses   Final diagnoses:  Chest tightness   Patient with complaint of chest tightness and shortness of breath after recent URI illness.  Given risk factors, troponin and EKG check x2 without elevation or ischemic EKG changes.  Chest x-ray is clear without signs of pneumonia.  Patient has had some improvement with albuterol here.  Home with the same.  No clinical signs of DVT or other signs of PE including tachycardia or hypoxia.  Do not feel that further workup is indicated.  ED Discharge Orders    None       Renne CriglerGeiple, Kenitra Leventhal, [place]-C 10/09/17 [id number]    Shon BatonHorton, Courtney F, MD 10/11/17 1336

## 2017-10-09 NOTE — ED Notes (Signed)
Report received from Adrian RN at the bedside 

## 2017-10-09 NOTE — ED Notes (Signed)
Awaiting blood work results, pt aware.

## 2017-12-16 ENCOUNTER — Telehealth: Payer: Self-pay | Admitting: *Deleted

## 2017-12-16 ENCOUNTER — Other Ambulatory Visit: Payer: Self-pay | Admitting: *Deleted

## 2017-12-16 DIAGNOSIS — B20 Human immunodeficiency virus [HIV] disease: Secondary | ICD-10-CM

## 2017-12-16 DIAGNOSIS — Z79899 Other long term (current) drug therapy: Secondary | ICD-10-CM

## 2017-12-16 DIAGNOSIS — Z113 Encounter for screening for infections with a predominantly sexual mode of transmission: Secondary | ICD-10-CM

## 2017-12-16 MED ORDER — ABACAVIR-DOLUTEGRAVIR-LAMIVUD 600-50-300 MG PO TABS: 1.0000 | ORAL_TABLET | Freq: Every day | ORAL | 1 refills | Status: DC

## 2017-12-16 NOTE — Telephone Encounter (Signed)
Patient returned call to schedule new transfer b20 appointment. He was on Triumeq and a multivitamin when [facility] was [facility], [facility]. He was recently incarcerated and able to continue treatment for that period, but has been out of medication for about 1 month. [facility] up [facility] management now. Appointments made for patient. Lab orders placed. Andree CossHowell, Vittoria Noreen M, RN

## 2017-12-16 NOTE — Telephone Encounter (Signed)
Ok to refill if that was his previous medication thanks

## 2017-12-17 ENCOUNTER — Other Ambulatory Visit: Payer: Medicare Other

## 2017-12-17 ENCOUNTER — Ambulatory Visit: Payer: Medicare Other

## 2017-12-17 ENCOUNTER — Other Ambulatory Visit (HOSPITAL_COMMUNITY)
Admission: RE | Admit: 2017-12-17 | Discharge: 2017-12-17 | Disposition: A | Discharge status: Home / Self Care | Payer: Medicare Other | Source: Ambulatory Visit | Attending: Infectious Diseases | Admitting: Infectious Diseases

## 2017-12-17 DIAGNOSIS — Z113 Encounter for screening for infections with a predominantly sexual mode of transmission: Secondary | ICD-10-CM

## 2017-12-17 DIAGNOSIS — B20 Human immunodeficiency virus [HIV] disease: Secondary | ICD-10-CM

## 2017-12-17 DIAGNOSIS — Z79899 Other long term (current) drug therapy: Secondary | ICD-10-CM | POA: Diagnosis not present

## 2017-12-18 LAB — URINALYSIS
Bilirubin Urine: NEGATIVE
Glucose, UA: NEGATIVE
Hgb urine dipstick: NEGATIVE
Ketones, ur: NEGATIVE
Leukocytes, UA: NEGATIVE
Nitrite: NEGATIVE
Protein, ur: NEGATIVE
Specific Gravity, Urine: 1.018 (ref 1.001–1.03)
pH: 7 (ref 5.0–8.0)

## 2017-12-18 LAB — URINE CYTOLOGY ANCILLARY ONLY
CHLAMYDIA, DNA PROBE: NEGATIVE
NEISSERIA GONORRHEA: NEGATIVE

## 2017-12-19 LAB — QUANTIFERON-TB GOLD PLUS
Mitogen-NIL: >10 IU/mL
NIL: 0.01 IU/mL
QUANTIFERON-TB GOLD PLUS: NEGATIVE
TB1-NIL: 0.01 IU/mL
TB2-NIL: 0 [IU]/mL

## 2017-12-19 LAB — HIV-1 RNA ULTRAQUANT REFLEX TO GENTYP+
HIV 1 RNA Quant: 185 Copies/mL — ABNORMAL HIGH
HIV-1 RNA QUANT, LOG: 2.27 {Log_copies}/mL — AB

## 2017-12-19 LAB — T-HELPER CELL (CD4) - (RCID CLINIC ONLY)
CD4 % Helper T Cell: 35 % (ref 33–55)
CD4 T Cell Abs: 870 /uL (ref 400–2700)

## 2017-12-23 LAB — CBC WITH DIFFERENTIAL/PLATELET
BASOS ABS: 19 {cells}/uL (ref 0–200)
Basophils Relative: 0.4 %
Eosinophils Absolute: 0 cells/uL — ABNORMAL LOW (ref 15–500)
Eosinophils Relative: 0 %
HCT: 42.6 % (ref 38.5–50.0)
HEMOGLOBIN: 14.7 g/dL (ref 13.2–17.1)
Lymphs Abs: 2218 cells/uL (ref 850–3900)
MCH: 28.9 pg (ref 27.0–33.0)
MCHC: 34.5 g/dL (ref 32.0–36.0)
MCV: 83.7 fL (ref 80.0–100.0)
MONOS PCT: 6.5 %
MPV: 9.6 fL (ref 7.5–12.5)
Neutro Abs: 2251 cells/uL (ref 1500–7800)
Neutrophils Relative %: 46.9 %
PLATELETS: 342 10*3/uL (ref 140–400)
RBC: 5.09 10*6/uL (ref 4.20–5.80)
RDW: 14 % (ref 11.0–15.0)
TOTAL LYMPHOCYTE: 46.2 %
WBC: 4.8 10*3/uL (ref 3.8–10.8)
WBCMIX: 312 {cells}/uL (ref 200–950)

## 2017-12-23 LAB — COMPLETE METABOLIC PANEL WITH GFR
AG RATIO: 1.5 (calc) (ref 1.0–2.5)
ALBUMIN MSPROF: 4.6 g/dL (ref 3.6–5.1)
ALKALINE PHOSPHATASE (APISO): 112 U/L (ref 40–115)
ALT: 21 U/L (ref 9–46)
AST: 28 U/L (ref 10–35)
BILIRUBIN TOTAL: 0.4 mg/dL (ref 0.2–1.2)
BUN: 16 mg/dL (ref 7–25)
CHLORIDE: 104 mmol/L (ref 98–110)
CO2: 24 mmol/L (ref 20–32)
Calcium: 9.6 mg/dL (ref 8.6–10.3)
Creat: 0.93 mg/dL (ref 0.70–1.33)
GFR, EST AFRICAN AMERICAN: 111 mL/min/{1.73_m2} (ref >=60)
GFR, Est Non African American: 95 mL/min/{1.73_m2} (ref >=60)
GLUCOSE: 85 mg/dL (ref 65–99)
Globulin: 3 g/dL (calc) (ref 1.9–3.7)
POTASSIUM: 4 mmol/L (ref 3.5–5.3)
Sodium: 138 mmol/L (ref 135–146)
Total Protein: 7.6 g/dL (ref 6.1–8.1)

## 2017-12-23 LAB — HCV RNA,QUANTITATIVE REAL TIME PCR
HCV QUANT LOG: NOT DETECTED {Log_IU}/mL
HCV RNA, PCR, QN: <15 IU/mL

## 2017-12-23 LAB — LIPID PANEL
Cholesterol: 179 mg/dL (ref <200)
HDL: 78 mg/dL (ref >40)
LDL Cholesterol (Calc): 85 mg/dL (calc)
Non-HDL Cholesterol (Calc): 101 mg/dL (calc) (ref <130)
Total CHOL/HDL Ratio: 2.3 (calc) (ref <5.0)
Triglycerides: 71 mg/dL (ref <150)

## 2017-12-23 LAB — HEPATITIS A ANTIBODY, TOTAL: Hepatitis A AB,Total: REACTIVE — AB

## 2017-12-23 LAB — HLA B*5701: HLA-B*5701 w/rflx HLA-B High: NEGATIVE

## 2017-12-23 LAB — HEPATITIS C ANTIBODY
Hepatitis C Ab: REACTIVE — AB
SIGNAL TO CUT-OFF: 1.8 — AB (ref <1.00)

## 2017-12-23 LAB — HEPATITIS B CORE ANTIBODY, TOTAL: Hep B Core Total Ab: REACTIVE — AB

## 2017-12-23 LAB — HEPATITIS B SURFACE ANTIGEN: Hepatitis B Surface Ag: NONREACTIVE

## 2017-12-23 LAB — HEPATITIS B SURFACE ANTIBODY,QUALITATIVE: Hep B S Ab: NONREACTIVE

## 2017-12-23 LAB — RPR: RPR: NONREACTIVE

## 2017-12-25 NOTE — Congregational Nurse Program (Signed)
Congregational Nurse Program Note  Date of Encounter: 11/27/2017  Past Medical History: Past Medical History:  Diagnosis Date  . Anxiety   . Bipolar 1 disorder (HCC)   . Bipolar 1 disorder (HCC)   . Depression   . HIV (human immunodeficiency virus infection) (HCC)   . Hypertension   . MI (myocardial infarction) (HCC)    x 2    Encounter Details: CNP Questionnaire - 12/02/17 1442      Questionnaire   Patient Status  Not Applicable    Race  Black or African American    Location Patient Served At  Not Applicable    Insurance  Medicaid    Uninsured  Not Applicable    Food  Yes, have food insecurities;Within past 12 months, worried food would run out with no money to buy more;Within past 12 months, food ran out with no money to buy more    Housing/Utilities  No permanent housing    Transportation  Yes, need transportation assistance;Provided transportation assistance (bus pass, taxi voucher, etc.)    Interpersonal Safety  No, do not feel physically and emotionally safe where you currently live    Medication  Yes, have medication insecurities    Medical Provider  No    Referrals  Restaurant manager, fast foodther;Area Agency;Behavioral/Mental Health Provider;Primary Care Provider/Clinic    ED Visit Averted  Not Applicable    Life-Saving Intervention Made  Not Applicable       Client reports he is ready for treatment due to alcoholism.  Client is HIV positive and needs medical clearance first.  Connected client with [facility] for case management.  Once he has had that appointment with medications prescribed, client is to return to explore inpatient treatment

## 2017-12-28 ENCOUNTER — Encounter (HOSPITAL_COMMUNITY): Payer: Self-pay

## 2017-12-28 ENCOUNTER — Other Ambulatory Visit: Payer: Self-pay

## 2017-12-28 DIAGNOSIS — Y929 Unspecified place or not applicable: Secondary | ICD-10-CM | POA: Diagnosis not present

## 2017-12-28 DIAGNOSIS — Y999 Unspecified external cause status: Secondary | ICD-10-CM | POA: Diagnosis not present

## 2017-12-28 DIAGNOSIS — Z79899 Other long term (current) drug therapy: Secondary | ICD-10-CM | POA: Diagnosis not present

## 2017-12-28 DIAGNOSIS — S0083XA Contusion of other part of head, initial encounter: Secondary | ICD-10-CM | POA: Diagnosis not present

## 2017-12-28 DIAGNOSIS — I10 Essential (primary) hypertension: Secondary | ICD-10-CM | POA: Insufficient documentation

## 2017-12-28 DIAGNOSIS — S0230XA Fracture of orbital floor, unspecified side, initial encounter for closed fracture: Secondary | ICD-10-CM | POA: Diagnosis not present

## 2017-12-28 DIAGNOSIS — Y939 Activity, unspecified: Secondary | ICD-10-CM | POA: Insufficient documentation

## 2017-12-28 DIAGNOSIS — F1414 Cocaine abuse with cocaine-induced mood disorder: Secondary | ICD-10-CM | POA: Insufficient documentation

## 2017-12-28 DIAGNOSIS — S0990XA Unspecified injury of head, initial encounter: Secondary | ICD-10-CM | POA: Insufficient documentation

## 2017-12-28 DIAGNOSIS — G8911 Acute pain due to trauma: Secondary | ICD-10-CM | POA: Diagnosis not present

## 2017-12-28 DIAGNOSIS — F17210 Nicotine dependence, cigarettes, uncomplicated: Secondary | ICD-10-CM | POA: Insufficient documentation

## 2017-12-28 DIAGNOSIS — S0450XA Injury of facial nerve, unspecified side, initial encounter: Secondary | ICD-10-CM | POA: Diagnosis not present
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
## 2017-12-29 ENCOUNTER — Emergency Department (HOSPITAL_COMMUNITY): Payer: Medicare Other

## 2017-12-29 ENCOUNTER — Encounter (HOSPITAL_COMMUNITY): Payer: Self-pay | Admitting: Behavioral Health

## 2017-12-29 ENCOUNTER — Emergency Department (HOSPITAL_COMMUNITY)
Admission: EM | Admit: 2017-12-29 | Discharge: 2017-12-29 | Disposition: A | Discharge status: Home / Self Care | Payer: Medicare Other | Attending: Emergency Medicine | Admitting: Emergency Medicine

## 2017-12-29 DIAGNOSIS — F1414 Cocaine abuse with cocaine-induced mood disorder: Secondary | ICD-10-CM

## 2017-12-29 DIAGNOSIS — S0990XA Unspecified injury of head, initial encounter: Secondary | ICD-10-CM | POA: Diagnosis not present

## 2017-12-29 DIAGNOSIS — S0083XA Contusion of other part of head, initial encounter: Secondary | ICD-10-CM

## 2017-12-29 DIAGNOSIS — S0230XA Fracture of orbital floor, unspecified side, initial encounter for closed fracture: Secondary | ICD-10-CM | POA: Diagnosis not present

## 2017-12-29 DIAGNOSIS — F39 Unspecified mood [affective] disorder: Secondary | ICD-10-CM

## 2017-12-29 LAB — COMPREHENSIVE METABOLIC PANEL
ALT: 30 U/L (ref 17–63)
AST: 37 U/L (ref 15–41)
Albumin: 3.9 g/dL (ref 3.5–5.0)
Alkaline Phosphatase: 94 U/L (ref 38–126)
Anion gap: 11 (ref 5–15)
BILIRUBIN TOTAL: 0.7 mg/dL (ref 0.3–1.2)
BUN: 11 mg/dL (ref 6–20)
CO2: 21 mmol/L — ABNORMAL LOW (ref 22–32)
Calcium: 8.7 mg/dL — ABNORMAL LOW (ref 8.9–10.3)
Chloride: 105 mmol/L (ref 101–111)
Creatinine, Ser: 0.84 mg/dL (ref 0.61–1.24)
Glucose, Bld: 89 mg/dL (ref 65–99)
POTASSIUM: 3.5 mmol/L (ref 3.5–5.1)
Sodium: 137 mmol/L (ref 135–145)
TOTAL PROTEIN: 7.5 g/dL (ref 6.5–8.1)

## 2017-12-29 LAB — SALICYLATE LEVEL

## 2017-12-29 LAB — CBC WITH DIFFERENTIAL/PLATELET
BASOS ABS: 0 10*3/uL (ref 0.0–0.1)
Basophils Relative: 0 %
EOS PCT: 0 %
Eosinophils Absolute: 0 10*3/uL (ref 0.0–0.7)
HEMATOCRIT: 40.2 % (ref 39.0–52.0)
Hemoglobin: 13.7 g/dL (ref 13.0–17.0)
LYMPHS ABS: 2.3 10*3/uL (ref 0.7–4.0)
LYMPHS PCT: 24 %
MCH: 29.3 pg (ref 26.0–34.0)
MCHC: 34.1 g/dL (ref 30.0–36.0)
MCV: 85.9 fL (ref 78.0–100.0)
MONO ABS: 0.7 10*3/uL (ref 0.1–1.0)
MONOS PCT: 7 %
NEUTROS ABS: 6.4 10*3/uL (ref 1.7–7.7)
Neutrophils Relative %: 69 %
PLATELETS: 254 10*3/uL (ref 150–400)
RBC: 4.68 MIL/uL (ref 4.22–5.81)
RDW: 14.6 % (ref 11.5–15.5)
WBC: 9.5 10*3/uL (ref 4.0–10.5)

## 2017-12-29 LAB — RAPID URINE DRUG SCREEN, HOSP PERFORMED
Amphetamines: NOT DETECTED
BARBITURATES: NOT DETECTED
Benzodiazepines: NOT DETECTED
COCAINE: POSITIVE — AB
Opiates: NOT DETECTED
Tetrahydrocannabinol: NOT DETECTED

## 2017-12-29 LAB — ETHANOL: Alcohol, Ethyl (B): 11 mg/dL — ABNORMAL HIGH (ref <10)

## 2017-12-29 LAB — ACETAMINOPHEN LEVEL

## 2017-12-29 MED ORDER — LORAZEPAM 2 MG/ML IJ SOLN: 0.0000 mg | Freq: Two times a day (BID) | INTRAMUSCULAR | Status: DC

## 2017-12-29 MED ORDER — LORAZEPAM 2 MG/ML IJ SOLN: 0.0000 mg | Freq: Four times a day (QID) | INTRAMUSCULAR | Status: DC

## 2017-12-29 MED ORDER — LORAZEPAM 1 MG PO TABS: 0.0000 mg | ORAL_TABLET | Freq: Two times a day (BID) | ORAL | Status: DC

## 2017-12-29 MED ORDER — LORAZEPAM 1 MG PO TABS
0.0000 mg | ORAL_TABLET | Freq: Four times a day (QID) | ORAL | Status: DC
  Administered 2017-12-29: 1 mg via ORAL
  Filled 2017-12-29: qty 1

## 2017-12-29 MED ORDER — IBUPROFEN 800 MG PO TABS
800.0000 mg | ORAL_TABLET | Freq: Once | ORAL | Status: AC
  Administered 2017-12-29: 800 mg via ORAL
  Filled 2017-12-29: qty 1

## 2017-12-29 MED ORDER — THIAMINE HCL 100 MG/ML IJ SOLN: 100.0000 mg | Freq: Every day | INTRAMUSCULAR | Status: DC

## 2017-12-29 MED ORDER — VITAMIN B-1 100 MG PO TABS
100.0000 mg | ORAL_TABLET | Freq: Every day | ORAL | Status: DC
  Administered 2017-12-29: 100 mg via ORAL
  Filled 2017-12-29: qty 1

## 2017-12-29 MED ORDER — GABAPENTIN 300 MG PO CAPS
300.0000 mg | ORAL_CAPSULE | Freq: Two times a day (BID) | ORAL | Status: DC
  Administered 2017-12-29: 300 mg via ORAL
  Filled 2017-12-29: qty 1

## 2017-12-29 MED ORDER — GABAPENTIN 300 MG PO CAPS: 300.0000 mg | ORAL_CAPSULE | Freq: Two times a day (BID) | ORAL | 0 refills | Status: DC
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
## 2017-12-29 NOTE — Patient Outreach (Signed)
[facility]PSS met with the patient and provided substance use recovery support. CPSS has also met with the patient on 08/11/17. Patient is interested in either receiving substance use treatment services at [facility] or [facility]. Patient is most interested in [facility]. Patient plans to follow up with them tomorrow for their transitional housing program and their [place] provided resources for both [facility] and [employer] and other information for substance use recovery resources in [place]. Some of these resources include an NA/AA meeting list, list of residential/outpatient treatment centers, and [facility]PSS contact information. CPSS encouraged the patient to contact [facility]PSS at anytime for substance use recovery resources or for substance use recovery support.
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
## 2017-12-29 NOTE — ED Notes (Signed)
Stephen Gordon was discharged per order and escorted off unit by staff after signing for all and receiving belongings. Stephen Gordon was ambulatory and in no acute distress. Stephen Gordon received AVS and script.

## 2018-01-01 ENCOUNTER — Ambulatory Visit: Payer: Self-pay

## 2018-01-01 ENCOUNTER — Encounter: Payer: Self-pay | Admitting: Infectious Diseases

## 2018-01-06 ENCOUNTER — Ambulatory Visit (INDEPENDENT_AMBULATORY_CARE_PROVIDER_SITE_OTHER): Payer: Medicare Other | Admitting: Internal Medicine

## 2018-01-06 ENCOUNTER — Encounter: Payer: Self-pay | Admitting: Internal Medicine

## 2018-01-06 ENCOUNTER — Ambulatory Visit (INDEPENDENT_AMBULATORY_CARE_PROVIDER_SITE_OTHER): Payer: Medicare Other | Admitting: Pharmacist

## 2018-01-06 ENCOUNTER — Ambulatory Visit (INDEPENDENT_AMBULATORY_CARE_PROVIDER_SITE_OTHER): Payer: Medicare Other | Admitting: Licensed Clinical Social Worker

## 2018-01-06 DIAGNOSIS — B20 Human immunodeficiency virus [HIV] disease: Secondary | ICD-10-CM | POA: Diagnosis not present

## 2018-01-06 DIAGNOSIS — F17210 Nicotine dependence, cigarettes, uncomplicated: Secondary | ICD-10-CM | POA: Insufficient documentation

## 2018-01-06 DIAGNOSIS — F1020 Alcohol dependence, uncomplicated: Secondary | ICD-10-CM

## 2018-01-06 DIAGNOSIS — R768 Other specified abnormal immunological findings in serum: Secondary | ICD-10-CM | POA: Diagnosis not present

## 2018-01-06 DIAGNOSIS — F319 Bipolar disorder, unspecified: Secondary | ICD-10-CM

## 2018-01-06 DIAGNOSIS — I10 Essential (primary) hypertension: Secondary | ICD-10-CM | POA: Diagnosis not present

## 2018-01-06 MED ORDER — MULTI-VITAMIN/MINERALS PO TABS: 1.0000 | ORAL_TABLET | Freq: Every day | ORAL | 11 refills | Status: AC

## 2018-01-06 MED ORDER — ABACAVIR-DOLUTEGRAVIR-LAMIVUD 600-50-300 MG PO TABS: 1.0000 | ORAL_TABLET | Freq: Every day | ORAL | 5 refills | Status: AC
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
## 2018-01-06 NOTE — Progress Notes (Signed)
isentress + truvada  Treatment program.  2007

## 2018-01-07 DIAGNOSIS — I10 Essential (primary) hypertension: Secondary | ICD-10-CM | POA: Insufficient documentation

## 2018-01-07 DIAGNOSIS — F319 Bipolar disorder, unspecified: Secondary | ICD-10-CM | POA: Insufficient documentation

## 2018-01-07 NOTE — Assessment & Plan Note (Signed)
He has low level viral activation after recently having to be off of his Triumeq.  I had him meet with pharmacy today to get him restarted as soon as possible.  He will follow-up after lab work in 6 weeks.

## 2018-01-07 NOTE — Assessment & Plan Note (Signed)
I encouraged him to stay in his drug treatment program.  I had him meet with our behavioral health counselor today.
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
## 2018-01-07 NOTE — Assessment & Plan Note (Signed)
He met with our behavioral health counselor today and will be [facility].

## 2018-01-20 DIAGNOSIS — N429 Disorder of prostate, unspecified: Secondary | ICD-10-CM | POA: Diagnosis not present

## 2018-01-20 DIAGNOSIS — B20 Human immunodeficiency virus [HIV] disease: Secondary | ICD-10-CM | POA: Diagnosis not present

## 2018-01-20 DIAGNOSIS — E785 Hyperlipidemia, unspecified: Secondary | ICD-10-CM | POA: Diagnosis not present

## 2018-01-20 DIAGNOSIS — Z5181 Encounter for therapeutic drug level monitoring: Secondary | ICD-10-CM | POA: Diagnosis not present

## 2018-01-20 DIAGNOSIS — K769 Liver disease, unspecified: Secondary | ICD-10-CM | POA: Diagnosis not present

## 2018-01-20 DIAGNOSIS — E039 Hypothyroidism, unspecified: Secondary | ICD-10-CM | POA: Diagnosis not present

## 2018-01-27 DIAGNOSIS — Z5181 Encounter for therapeutic drug level monitoring: Secondary | ICD-10-CM | POA: Diagnosis not present

## 2018-01-31 ENCOUNTER — Encounter: Payer: Self-pay | Admitting: Behavioral Health

## 2018-02-06 DIAGNOSIS — Z5181 Encounter for therapeutic drug level monitoring: Secondary | ICD-10-CM | POA: Diagnosis not present

## 2018-02-18 ENCOUNTER — Ambulatory Visit: Payer: Self-pay

## 2018-02-18 DIAGNOSIS — Z5181 Encounter for therapeutic drug level monitoring: Secondary | ICD-10-CM | POA: Diagnosis not present

## 2018-02-25 ENCOUNTER — Other Ambulatory Visit: Payer: Self-pay

## 2018-03-11 ENCOUNTER — Other Ambulatory Visit: Payer: Self-pay | Admitting: Pharmacist

## 2018-03-11 ENCOUNTER — Encounter: Payer: Self-pay | Admitting: Internal Medicine

## 2018-03-11 ENCOUNTER — Ambulatory Visit (INDEPENDENT_AMBULATORY_CARE_PROVIDER_SITE_OTHER): Payer: Medicare Other | Admitting: Internal Medicine

## 2018-03-11 ENCOUNTER — Other Ambulatory Visit: Payer: Self-pay | Admitting: *Deleted

## 2018-03-11 ENCOUNTER — Telehealth: Payer: Self-pay | Admitting: Pharmacist

## 2018-03-11 DIAGNOSIS — F17210 Nicotine dependence, cigarettes, uncomplicated: Secondary | ICD-10-CM | POA: Diagnosis not present

## 2018-03-11 DIAGNOSIS — B20 Human immunodeficiency virus [HIV] disease: Secondary | ICD-10-CM

## 2018-03-11 DIAGNOSIS — I10 Essential (primary) hypertension: Secondary | ICD-10-CM | POA: Diagnosis not present

## 2018-03-11 DIAGNOSIS — F1020 Alcohol dependence, uncomplicated: Secondary | ICD-10-CM | POA: Diagnosis not present

## 2018-03-11 NOTE — Progress Notes (Signed)
Patient Active Problem List   Diagnosis Date Noted  . HIV disease (HCC) 01/06/2018    Priority: High  . Bipolar 1 disorder (HCC) 01/07/2018  . Hypertension 01/07/2018  . Hepatitis C antibody test positive 01/06/2018  . Cigarette smoker 01/06/2018  . Cocaine abuse with cocaine-induced mood disorder (HCC) 08/11/2017  . Alcohol use disorder, moderate, dependence (HCC) 08/11/2017    Patient's Medications  New Prescriptions   No medications on file  Previous Medications   ABACAVIR-DOLUTEGRAVIR-LAMIVUDINE (TRIUMEQ) 600-50-300 MG TABLET    Take 1 tablet by mouth daily.   GABAPENTIN (NEURONTIN) 300 MG CAPSULE    Take 1 capsule (300 mg total) by mouth 2 (two) times daily.   MULTIPLE VITAMINS-MINERALS (MULTIVITAMIN WITH MINERALS) TABLET    Take 1 tablet by mouth daily.  Modified Medications   No medications on file  Discontinued Medications   No medications on file    Subjective: Stephen Gordon is in for his routine HIV follow-up visit.  He has had no problems obtaining, taking or tolerating his Triumeq since restarting it several months ago.  It is delivered to him by [facility] pharmacy.  He thinks he may have missed only one dose in the past few months.  He remains in [place], a drug and alcohol recovery program.  He has been very busy taking classes during the day in the program and looking for housing.  He is working with his [employer] case Production designer, theatre/television/film, Youth worker.  He says he is only had one relapse of alcohol use since his last visit.  He has not been able to get a primary care doctor yet.  He [facility] the [facility].  Review of Systems: Review of Systems  Constitutional: Negative for chills, diaphoresis, fever, malaise/fatigue and weight loss.  HENT: Negative for sore throat.   Respiratory: Negative for cough, sputum production and shortness of breath.   Cardiovascular: Negative for chest pain.  Gastrointestinal: Negative for abdominal pain, diarrhea, heartburn, nausea  and vomiting.  Genitourinary: Negative for dysuria and frequency.  Musculoskeletal: Negative for joint pain and myalgias.  Skin: Negative for rash.  Neurological: Negative for dizziness and headaches.  Psychiatric/Behavioral: Positive for substance abuse. Negative for depression. The patient is not nervous/anxious.     Past Medical History:  Diagnosis Date  . Anxiety   . Bipolar 1 disorder (HCC)   . Bipolar 1 disorder (HCC)   . Depression   . HIV (human immunodeficiency virus infection) (HCC)   . Hypertension   . MI (myocardial infarction) (HCC)    x 2    Social History   Tobacco Use  . Smoking status: Current Every Day Smoker    Packs/day: 0.50    Years: 30.00    Pack years: 15.00    Types: Cigarettes  . Smokeless tobacco: Never Used  Substance Use Topics  . Alcohol use: Yes    Comment: .11 BAC  . Drug use: Yes    Frequency: 1.0 times per week    Types: Cocaine    Comment: +Coc    No family history on file.  No Known Allergies  Health Maintenance  Topic Date Due  . TETANUS/TDAP  06/27/1986  . COLONOSCOPY  06/27/2017  . INFLUENZA VACCINE  04/24/2018  . HIV Screening  Completed    Objective:  Vitals:   03/11/18 1128  BP: (!) 156/105  Pulse: 76  Temp: 97.8 F (36.6 C)  TempSrc: Oral  Weight: 166 lb 12.8  oz (75.7 kg)   Body mass index is 24.28 kg/m.  Physical Exam  Constitutional: He is oriented to person, place, and time.  He is very talkative and in good spirits.  HENT:  Mouth/Throat: No oropharyngeal exudate.  Eyes: Conjunctivae are normal.  Cardiovascular: Normal rate, regular rhythm and normal heart sounds.  No murmur heard. Pulmonary/Chest: Effort normal and breath sounds normal.  Abdominal: Soft. He exhibits no mass. There is no tenderness.  Musculoskeletal: Normal range of motion.  Neurological: He is alert and oriented to person, place, and time.  Skin: No rash noted.  Psychiatric: He has a normal mood and affect.    Lab  Results Lab Results  Component Value Date   WBC 9.5 12/29/2017   HGB 13.7 12/29/2017   HCT 40.2 12/29/2017   MCV 85.9 12/29/2017   PLT 254 12/29/2017    Lab Results  Component Value Date   CREATININE 0.84 12/29/2017   BUN 11 12/29/2017   NA 137 12/29/2017   K 3.5 12/29/2017   CL 105 12/29/2017   CO2 21 (L) 12/29/2017    Lab Results  Component Value Date   ALT 30 12/29/2017   AST 37 12/29/2017   ALKPHOS 94 12/29/2017   BILITOT 0.7 12/29/2017    Lab Results  Component Value Date   CHOL 179 12/17/2017   HDL 78 12/17/2017   LDLCALC 85 12/17/2017   TRIG 71 12/17/2017   CHOLHDL 2.3 12/17/2017   Lab Results  Component Value Date   LABRPR NON-REACTIVE 12/17/2017   HIV 1 RNA Quant (Copies/mL)  Date Value  12/17/2017 185 (H)   CD4 T Cell Abs (/uL)  Date Value  12/17/2017 870     Problem List Items Addressed This Visit      High   HIV disease (HCC)    It sounds as though his adherence is very good.  He will continue Triumeq, to repeat lab work today and follow-up in 3 months.      Relevant Orders   T-helper cell (CD4)- ([facility] clinic only)   HIV 1 RNA quant-no reflex-bld     Unprioritized   Alcohol use disorder, moderate, dependence (HCC)    I congratulated him on his continued commitment to sobriety.      Cigarette smoker    He does not feel that he is ready to attempt to cut down and quit smoking yet.      Hypertension    He does not know the name or dose of his blood pressure medication.  I have asked our pharmacist pharmacy to get a complete list of his medications.  We will also get him written information about primary care clinics.           Cliffton AstersJohn Doss Cybulski, MD [facility] [phone] pager   [phone] cell 03/11/2018, 12:05 PM

## 2018-03-11 NOTE — Assessment & Plan Note (Signed)
I congratulated him on his continued commitment to sobriety.

## 2018-03-11 NOTE — Telephone Encounter (Signed)
Called [facility] [place] and had them fax a current medication list for patient.  Here are the medications he has filled since he starting filling at [facility] in March of this year:  - Triumeq PO once daily - Amlodipine 10 mg PO once daily - Hydroxyzine 25 mg PO q6h PRN anxiety or sleep - Multivitamin daily - Gabapentin 300 mg PO TID - Citalopram 40 mg PO once daily  Will add these medications to his medication list in epic.

## 2018-03-11 NOTE — Assessment & Plan Note (Signed)
It sounds as though his adherence is very good.  He will continue Triumeq, to repeat lab work today and follow-up in 3 months.

## 2018-03-11 NOTE — Addendum Note (Signed)
Addended by: Robinette HainesKUPPELWEISER, CASSIE L on: 03/11/2018 04:51 PM   Modules accepted: Orders

## 2018-03-11 NOTE — Assessment & Plan Note (Signed)
He does not know the name or dose of his blood pressure medication.  I have asked our pharmacist pharmacy to get a complete list of his medications.  We will also get him written information about primary care clinics.

## 2018-03-11 NOTE — Assessment & Plan Note (Signed)
He does not feel that he is ready to attempt to cut down and quit smoking yet.

## 2018-03-12 DIAGNOSIS — Z5181 Encounter for therapeutic drug level monitoring: Secondary | ICD-10-CM | POA: Diagnosis not present

## 2018-03-12 LAB — T-HELPER CELL (CD4) - (RCID CLINIC ONLY)
CD4 % Helper T Cell: 33 % (ref 33–55)
CD4 T CELL ABS: 740 /uL (ref 400–2700)

## 2018-03-12 NOTE — Telephone Encounter (Signed)
Thanks

## 2018-03-13 LAB — HIV-1 RNA QUANT-NO REFLEX-BLD
HIV 1 RNA QUANT: 21 {copies}/mL — AB
HIV-1 RNA Quant, Log: 1.32 Log copies/mL — ABNORMAL HIGH

## 2018-03-27 ENCOUNTER — Other Ambulatory Visit: Payer: Self-pay

## 2018-03-27 ENCOUNTER — Emergency Department (HOSPITAL_COMMUNITY): Payer: Medicare Other

## 2018-03-27 ENCOUNTER — Emergency Department (HOSPITAL_COMMUNITY)
Admission: EM | Admit: 2018-03-27 | Discharge: 2018-03-27 | Disposition: A | Discharge status: Home / Self Care | Payer: Medicare Other | Attending: Emergency Medicine | Admitting: Emergency Medicine

## 2018-03-27 ENCOUNTER — Encounter (HOSPITAL_COMMUNITY): Payer: Self-pay | Admitting: Emergency Medicine

## 2018-03-27 DIAGNOSIS — I959 Hypotension, unspecified: Secondary | ICD-10-CM | POA: Diagnosis not present

## 2018-03-27 DIAGNOSIS — Y999 Unspecified external cause status: Secondary | ICD-10-CM | POA: Diagnosis not present

## 2018-03-27 DIAGNOSIS — Z79899 Other long term (current) drug therapy: Secondary | ICD-10-CM | POA: Diagnosis not present

## 2018-03-27 DIAGNOSIS — Z21 Asymptomatic human immunodeficiency virus [HIV] infection status: Secondary | ICD-10-CM | POA: Insufficient documentation

## 2018-03-27 DIAGNOSIS — S0990XA Unspecified injury of head, initial encounter: Secondary | ICD-10-CM | POA: Diagnosis not present

## 2018-03-27 DIAGNOSIS — S0993XA Unspecified injury of face, initial encounter: Secondary | ICD-10-CM | POA: Diagnosis not present

## 2018-03-27 DIAGNOSIS — Y929 Unspecified place or not applicable: Secondary | ICD-10-CM | POA: Diagnosis not present

## 2018-03-27 DIAGNOSIS — I10 Essential (primary) hypertension: Secondary | ICD-10-CM | POA: Diagnosis not present

## 2018-03-27 DIAGNOSIS — S01511A Laceration without foreign body of lip, initial encounter: Secondary | ICD-10-CM | POA: Insufficient documentation

## 2018-03-27 DIAGNOSIS — S0083XA Contusion of other part of head, initial encounter: Secondary | ICD-10-CM

## 2018-03-27 DIAGNOSIS — S0093XA Contusion of unspecified part of head, initial encounter: Secondary | ICD-10-CM | POA: Diagnosis not present

## 2018-03-27 DIAGNOSIS — R609 Edema, unspecified: Secondary | ICD-10-CM | POA: Diagnosis not present

## 2018-03-27 DIAGNOSIS — R220 Localized swelling, mass and lump, head: Secondary | ICD-10-CM | POA: Diagnosis not present

## 2018-03-27 DIAGNOSIS — F17210 Nicotine dependence, cigarettes, uncomplicated: Secondary | ICD-10-CM | POA: Diagnosis not present

## 2018-03-27 DIAGNOSIS — Y939 Activity, unspecified: Secondary | ICD-10-CM | POA: Diagnosis not present

## 2018-03-27 NOTE — Discharge Instructions (Addendum)
Return here as needed.  Take Tylenol and Motrin for any pain.  Use ice on the areas that are swollen.
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
## 2018-03-27 NOTE — ED Notes (Signed)
Patient transported to CT 

## 2018-04-02 ENCOUNTER — Other Ambulatory Visit: Payer: Self-pay | Admitting: Internal Medicine

## 2018-04-15 ENCOUNTER — Other Ambulatory Visit: Payer: Self-pay | Admitting: Internal Medicine

## 2018-05-13 ENCOUNTER — Other Ambulatory Visit: Payer: Self-pay | Admitting: Internal Medicine

## 2018-05-20 ENCOUNTER — Telehealth: Payer: Self-pay | Admitting: *Deleted

## 2018-05-20 NOTE — Telephone Encounter (Signed)
Patient called to confirm his pharmacy. [place] is [place] in [place] is considering staying there. He reached out to his [employer] Case Manager Dorene Grebeatalie to make her aware.  He will decide within the next month if he is staying or if he will return. He is aware of his appointment with Dr Orvan Falconerampbell on 9/18 -  He will cancel this if he decides to stay in [place]. He will also reach out to the local health department to find a health care provider there. Stephen Gordon, Stephen Kilcrease M, RN

## 2018-06-11 ENCOUNTER — Ambulatory Visit: Payer: Self-pay | Admitting: Internal Medicine

## 2018-11-21 ENCOUNTER — Telehealth: Payer: Self-pay

## 2018-11-21 NOTE — Telephone Encounter (Signed)
Received a call from Manuela Schwartz at [facility] regarding mutual patient. Rn would like to know if patient was ever given a pneumonia vaccine while in care with our office. No record of immunization was in Epic. Informed Manuela Schwartz that patient did not receive pneumonia vaccine with our office. Lorenso Courier, [place]

## 2018-11-24 ENCOUNTER — Telehealth: Payer: Self-pay

## 2018-11-24 NOTE — Telephone Encounter (Signed)
[facility] [place], requesting copy of immunizations.  Patent did note receive any vaccine at this clinic.  Release received.  Form faxed stating the above information to [phone] along with last office visit.   Laurell Josephs, RN
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
